# Patient Record
Sex: Female | Born: 1945 | Race: White | Hispanic: No | Marital: Married | Smoking: Never smoker
Health system: Southern US, Community
[De-identification: ages and names within clinical notes are randomized; demographics above are authoritative.]

## PROBLEM LIST (undated history)

## (undated) DIAGNOSIS — M1990 Unspecified osteoarthritis, unspecified site: Secondary | ICD-10-CM

## (undated) DIAGNOSIS — E079 Disorder of thyroid, unspecified: Secondary | ICD-10-CM

## (undated) DIAGNOSIS — C50919 Malignant neoplasm of unspecified site of unspecified female breast: Secondary | ICD-10-CM

## (undated) DIAGNOSIS — I10 Essential (primary) hypertension: Secondary | ICD-10-CM

## (undated) HISTORY — PX: MASTECTOMY: SHX3

## (undated) HISTORY — PX: ABDOMINAL HYSTERECTOMY: SHX81

## (undated) HISTORY — PX: BACK SURGERY: SHX140

---

## 2015-10-18 ENCOUNTER — Inpatient Hospital Stay (HOSPITAL_BASED_OUTPATIENT_CLINIC_OR_DEPARTMENT_OTHER)
Admission: EM | Admit: 2015-10-18 | Discharge: 2015-10-20 | DRG: 392 | Disposition: A | Discharge status: Home / Self Care | Payer: Medicare Other | Attending: Internal Medicine | Admitting: Internal Medicine

## 2015-10-18 ENCOUNTER — Encounter (HOSPITAL_BASED_OUTPATIENT_CLINIC_OR_DEPARTMENT_OTHER): Payer: Self-pay | Admitting: Emergency Medicine

## 2015-10-18 DIAGNOSIS — E871 Hypo-osmolality and hyponatremia: Secondary | ICD-10-CM | POA: Diagnosis present

## 2015-10-18 DIAGNOSIS — R112 Nausea with vomiting, unspecified: Secondary | ICD-10-CM | POA: Diagnosis not present

## 2015-10-18 DIAGNOSIS — I10 Essential (primary) hypertension: Secondary | ICD-10-CM | POA: Insufficient documentation

## 2015-10-18 DIAGNOSIS — F419 Anxiety disorder, unspecified: Secondary | ICD-10-CM | POA: Diagnosis present

## 2015-10-18 DIAGNOSIS — Z9010 Acquired absence of unspecified breast and nipple: Secondary | ICD-10-CM

## 2015-10-18 DIAGNOSIS — F39 Unspecified mood [affective] disorder: Secondary | ICD-10-CM | POA: Diagnosis present

## 2015-10-18 DIAGNOSIS — R630 Anorexia: Secondary | ICD-10-CM | POA: Diagnosis present

## 2015-10-18 DIAGNOSIS — Z853 Personal history of malignant neoplasm of breast: Secondary | ICD-10-CM

## 2015-10-18 DIAGNOSIS — Z79899 Other long term (current) drug therapy: Secondary | ICD-10-CM

## 2015-10-18 DIAGNOSIS — T43225A Adverse effect of selective serotonin reuptake inhibitors, initial encounter: Secondary | ICD-10-CM | POA: Diagnosis present

## 2015-10-18 DIAGNOSIS — E876 Hypokalemia: Secondary | ICD-10-CM | POA: Diagnosis present

## 2015-10-18 DIAGNOSIS — Z880 Allergy status to penicillin: Secondary | ICD-10-CM

## 2015-10-18 DIAGNOSIS — E039 Hypothyroidism, unspecified: Secondary | ICD-10-CM | POA: Insufficient documentation

## 2015-10-18 DIAGNOSIS — E860 Dehydration: Secondary | ICD-10-CM | POA: Diagnosis present

## 2015-10-18 DIAGNOSIS — E785 Hyperlipidemia, unspecified: Secondary | ICD-10-CM | POA: Insufficient documentation

## 2015-10-18 HISTORY — DX: Disorder of thyroid, unspecified: E07.9

## 2015-10-18 HISTORY — DX: Unspecified osteoarthritis, unspecified site: M19.90

## 2015-10-18 HISTORY — DX: Malignant neoplasm of unspecified site of unspecified female breast: C50.919

## 2015-10-18 HISTORY — DX: Essential (primary) hypertension: I10

## 2015-10-18 LAB — BASIC METABOLIC PANEL
ANION GAP: 12 (ref 5–15)
BUN: 12 mg/dL (ref 6–20)
CO2: 26 mmol/L (ref 22–32)
Calcium: 9.3 mg/dL (ref 8.9–10.3)
Chloride: 82 mmol/L — ABNORMAL LOW (ref 101–111)
Creatinine, Ser: 0.62 mg/dL (ref 0.44–1.00)
GFR calc Af Amer: >60 mL/min (ref >60)
GLUCOSE: 123 mg/dL — AB (ref 65–99)
POTASSIUM: 2.9 mmol/L — AB (ref 3.5–5.1)
Sodium: 120 mmol/L — ABNORMAL LOW (ref 135–145)

## 2015-10-18 LAB — CBC
HCT: 39.7 % (ref 36.0–46.0)
HEMOGLOBIN: 14.9 g/dL (ref 12.0–15.0)
MCH: 33.8 pg (ref 26.0–34.0)
MCHC: 37.5 g/dL — AB (ref 30.0–36.0)
MCV: 90 fL (ref 78.0–100.0)
Platelets: 352 10*3/uL (ref 150–400)
RBC: 4.41 MIL/uL (ref 3.87–5.11)
RDW: 11.2 % — ABNORMAL LOW (ref 11.5–15.5)
WBC: 14.9 10*3/uL — ABNORMAL HIGH (ref 4.0–10.5)

## 2015-10-18 MED ORDER — SODIUM CHLORIDE 0.9 % IV BOLUS (SEPSIS)
500.0000 mL | Freq: Once | INTRAVENOUS | Status: AC
  Administered 2015-10-18: 500 mL via INTRAVENOUS

## 2015-10-18 MED ORDER — MAGNESIUM SULFATE 2 GM/50ML IV SOLN
2.0000 g | Freq: Once | INTRAVENOUS | Status: AC
  Administered 2015-10-19: 2 g via INTRAVENOUS
  Filled 2015-10-18: qty 50

## 2015-10-18 MED ORDER — POTASSIUM CHLORIDE CRYS ER 20 MEQ PO TBCR
60.0000 meq | EXTENDED_RELEASE_TABLET | Freq: Once | ORAL | Status: AC
  Administered 2015-10-18: 60 meq via ORAL
  Filled 2015-10-18: qty 3

## 2015-10-18 MED ORDER — POTASSIUM CHLORIDE 10 MEQ/100ML IV SOLN
10.0000 meq | INTRAVENOUS | Status: AC
  Administered 2015-10-18: 10 meq via INTRAVENOUS
  Filled 2015-10-18: qty 100

## 2015-10-18 MED ORDER — ONDANSETRON HCL 4 MG/2ML IJ SOLN
4.0000 mg | Freq: Once | INTRAMUSCULAR | Status: AC
  Administered 2015-10-18: 4 mg via INTRAVENOUS
  Filled 2015-10-18: qty 2

## 2015-10-18 NOTE — ED Provider Notes (Addendum)
CSN: [id number]     Arrival date & time 10/18/15  2114 History  By signing my name below, I, Evelene Croon, attest that this documentation has been prepared under the direction and in the presence of Sharlett Iles, MD . Electronically Signed: Evelene Croon, Scribe. 10/18/2015. 11:05 PM.    Chief Complaint  Patient presents with  . Critical Lab Value from PCP    The history is provided by the patient. No language interpreter was used.    HPI Comments:  Paula Perez is a 70 y.o. female who presents to the Emergency Department from PCP's office for abnormal lab work. She was prescribed Paroxetine for anxiety which she started taking 1 week ago and "had a reaction to it". She states she became nauseous, vomited, and had palpitations after she started taking it. She stopped taking the pill after 4 days but the nausea has persisted. She also notes associated lightheadedness.  She followed up with PCP today and had blood work drawn. Her sodium and potassium levels were very low and she was sent to ED for further evaluation. No alleviating factors noted. Pt denies pain at this time. No fevers, abdominal pain, cough/cold symptoms, or recent illness. She notes that she has had a decreased appetite for "a while." No urinary sx.  PCP- [facility])   Past Medical History  Diagnosis Date  . Thyroid disease   . Hypertension   . Arthritis   . Cancer [facility])    Past Surgical History  Procedure Laterality Date  . Breast surgery    . Abdominal hysterectomy    . Back surgery     No family history on file. Social History  Substance Use Topics  . Smoking status: Never Smoker   . Smokeless tobacco: None  . Alcohol Use: Yes   OB History    No data available     Review of Systems 10 systems reviewed and all are negative for acute change except as noted in the HPI.   Allergies  Penicillins  Home Medications   Prior to Admission medications   Medication Sig Start Date End Date  Taking? Authorizing Provider  hydrochlorothiazide (HYDRODIURIL) 12.5 MG tablet Take 12.5 mg by mouth daily.   Yes Historical Provider, MD  levothyroxine (SYNTHROID, LEVOTHROID) 100 MCG tablet Take 100 mcg by mouth daily before breakfast.   Yes Historical Provider, MD  Multiple Vitamin (MULTIVITAMIN) capsule Take 1 capsule by mouth daily.   Yes Historical Provider, MD   BP 163/100 mmHg  Pulse 107  Temp(Src) 97.8 F (36.6 C) (Oral)  Resp 18  Ht 5\' 3"  (1.6 m)  Wt 121 lb (54.885 kg)  BMI 21.44 kg/m2  SpO2 100%  LMP  (Approximate) Physical Exam  Constitutional: She is oriented to person, place, and time. She appears well-developed and well-nourished. No distress.  HENT:  Head: Normocephalic and atraumatic.  Mildly dry mucous membranes  Eyes: Conjunctivae are normal. Pupils are equal, round, and reactive to light.  Neck: Neck supple.  Cardiovascular: Normal rate, regular rhythm and normal heart sounds.   No murmur heard. Pulmonary/Chest: Effort normal and breath sounds normal.  Abdominal: Soft. Bowel sounds are normal. She exhibits no distension. There is no tenderness.  Musculoskeletal: She exhibits no edema.  Neurological: She is alert and oriented to person, place, and time.  Fluent speech  Skin: Skin is warm and dry. There is pallor.  Psychiatric: She has a normal mood and affect. Judgment normal.  Nursing note and vitals reviewed.  ED Course  .Critical Care Performed by: Sharlett Iles Authorized by: Sharlett Iles Total critical care time: 45 minutes Critical care time was exclusive of separately billable procedures and treating other patients. Critical care was necessary to treat or prevent imminent or life-threatening deterioration of the following conditions: metabolic crisis. Critical care was time spent personally by me on the following activities: development of treatment plan with patient or surrogate, discussions with consultants, evaluation of  patient's response to treatment, examination of patient, obtaining history from patient or surrogate, ordering and performing treatments and interventions, ordering and review of laboratory studies and re-evaluation of patient's condition.     DIAGNOSTIC STUDIES:  Oxygen Saturation is 100% on RA, normal by my interpretation.    COORDINATION OF CARE:  11:03 PM Pt will be transferred for admission. Discussed treatment plan with pt at bedside and pt agreed to plan.  Labs Review Labs Reviewed  BASIC METABOLIC PANEL - Abnormal; Notable for the following:    Sodium 120 (*)    Potassium 2.9 (*)    Chloride 82 (*)    Glucose, Bld 123 (*)    All other components within normal limits  CBC - Abnormal; Notable for the following:    WBC 14.9 (*)    MCHC 37.5 (*)    RDW 11.2 (*)    All other components within normal limits  URINALYSIS, ROUTINE W REFLEX MICROSCOPIC (NOT AT [facility])    Imaging Review No results found. I have personally reviewed and evaluated these lab results as part of my medical decision-making.   EKG Interpretation   Date/Time:  Tuesday October 18 2015 22:32:17 EST Ventricular Rate:  90 PR Interval:  142 QRS Duration: 94 QT Interval:  369 QTC Calculation: 451 R Axis:   83 Text Interpretation:  Sinus rhythm Ventricular premature complex  Borderline right axis deviation Nonspecific T abnormalities, lateral leads  No previous ECGs available Confirmed by Kaja Jackowski MD, Nyela Cortinas [phone]) on  10/18/2015 11:07:58 PM     Medications  potassium chloride 10 mEq in 100 mL IVPB (10 mEq Intravenous New Bag/Given 10/18/15 2250)  magnesium sulfate IVPB 2 g 50 mL (not administered)  ondansetron (ZOFRAN) injection 4 mg (4 mg Intravenous Given 10/18/15 2241)  potassium chloride SA (K-DUR,KLOR-CON) CR tablet 60 mEq (60 mEq Oral Given 10/18/15 2242)  sodium chloride 0.9 % bolus 500 mL (500 mLs Intravenous New Bag/Given 10/18/15 2238)    MDM   Final diagnoses:  Hyponatremia  Hypokalemia    Patient presents for evaluation after she had routine blood work done at her PCPs office and was found to have abnormal sodium and potassium levels. She recently was on paroxetine briefly but discontinued after 4 days due to concern for side effects. On exam, she was pale and frail appearing but in no acute distress. Vital signs notable for hypertension at 163/100. No abdominal tenderness and no complaints the exception of mild nausea. She denies any infectious symptoms.   EKG w/ non-specific T wave changes, no previous available for comparison.  Lab work here shows sodium 120, potassium 2.9, chloride 82. Normal creatinine. WBC slightly elevated at 14.9, UA ordered. Gave the patient IV and oral potassium repletion as well as magnesium. Also gave Zofran and a small IV fluid bolus. I discussed with patient the life-threatening nature of hyponatremia and need for inpatient treatment. I am reassured that she is not demonstrating any neurologic symptoms. She has requested [facility]; discussed with hospitalist there, Dr. Pincus Large, appreciate his assistance. He  has accepted the patient. She will be transferred for further care.  12:52 AM Spoke with [facility] and they have no available beds. I discussed with [facility] hospitalist, Dr. Arnoldo Morale, who has accepted the patient. The patient is in agreement to transport to [facility].  I personally performed the services described in this documentation, which was scribed in my presence. The recorded information has been reviewed and is accurate.    Sharlett Iles, MD 10/18/15 [place], MD 10/19/15 [phone]

## 2015-10-18 NOTE — ED Notes (Signed)
Pt had blood work done at PCP, reports critical NA and K+ level and to go the ER tonight. Symptoms nausea and lightheaded. Recently taking a medication for anxiety but stopped due to the symptoms.

## 2015-10-18 NOTE — ED Notes (Signed)
MD at bedside. 

## 2015-10-19 ENCOUNTER — Encounter (HOSPITAL_BASED_OUTPATIENT_CLINIC_OR_DEPARTMENT_OTHER): Payer: Self-pay | Admitting: Internal Medicine

## 2015-10-19 ENCOUNTER — Inpatient Hospital Stay (HOSPITAL_COMMUNITY): Payer: Medicare Other

## 2015-10-19 DIAGNOSIS — E876 Hypokalemia: Secondary | ICD-10-CM | POA: Diagnosis present

## 2015-10-19 DIAGNOSIS — R112 Nausea with vomiting, unspecified: Secondary | ICD-10-CM | POA: Diagnosis present

## 2015-10-19 DIAGNOSIS — Z9010 Acquired absence of unspecified breast and nipple: Secondary | ICD-10-CM | POA: Diagnosis not present

## 2015-10-19 DIAGNOSIS — E785 Hyperlipidemia, unspecified: Secondary | ICD-10-CM | POA: Diagnosis present

## 2015-10-19 DIAGNOSIS — I10 Essential (primary) hypertension: Secondary | ICD-10-CM | POA: Diagnosis present

## 2015-10-19 DIAGNOSIS — E871 Hypo-osmolality and hyponatremia: Secondary | ICD-10-CM | POA: Diagnosis present

## 2015-10-19 DIAGNOSIS — E039 Hypothyroidism, unspecified: Secondary | ICD-10-CM | POA: Diagnosis present

## 2015-10-19 DIAGNOSIS — F419 Anxiety disorder, unspecified: Secondary | ICD-10-CM | POA: Diagnosis present

## 2015-10-19 DIAGNOSIS — Z880 Allergy status to penicillin: Secondary | ICD-10-CM | POA: Diagnosis not present

## 2015-10-19 DIAGNOSIS — Z79899 Other long term (current) drug therapy: Secondary | ICD-10-CM | POA: Diagnosis not present

## 2015-10-19 DIAGNOSIS — E860 Dehydration: Secondary | ICD-10-CM | POA: Diagnosis present

## 2015-10-19 DIAGNOSIS — R630 Anorexia: Secondary | ICD-10-CM | POA: Diagnosis present

## 2015-10-19 DIAGNOSIS — T43225A Adverse effect of selective serotonin reuptake inhibitors, initial encounter: Secondary | ICD-10-CM | POA: Diagnosis present

## 2015-10-19 DIAGNOSIS — Z853 Personal history of malignant neoplasm of breast: Secondary | ICD-10-CM | POA: Diagnosis not present

## 2015-10-19 DIAGNOSIS — F39 Unspecified mood [affective] disorder: Secondary | ICD-10-CM | POA: Diagnosis present

## 2015-10-19 LAB — URINALYSIS, ROUTINE W REFLEX MICROSCOPIC
BILIRUBIN URINE: NEGATIVE
GLUCOSE, UA: NEGATIVE mg/dL
Hgb urine dipstick: NEGATIVE
KETONES UR: 15 mg/dL — AB
Leukocytes, UA: NEGATIVE
Nitrite: NEGATIVE
PH: 6 (ref 5.0–8.0)
Protein, ur: NEGATIVE mg/dL
Specific Gravity, Urine: 1.01 (ref 1.005–1.030)

## 2015-10-19 LAB — BASIC METABOLIC PANEL
ANION GAP: 10 (ref 5–15)
BUN: 6 mg/dL (ref 6–20)
CALCIUM: 8.3 mg/dL — AB (ref 8.9–10.3)
CHLORIDE: 90 mmol/L — AB (ref 101–111)
CO2: 25 mmol/L (ref 22–32)
CREATININE: 0.46 mg/dL (ref 0.44–1.00)
GFR calc Af Amer: >60 mL/min (ref >60)
GFR calc non Af Amer: >60 mL/min (ref >60)
GLUCOSE: 98 mg/dL (ref 65–99)
Potassium: 3.3 mmol/L — ABNORMAL LOW (ref 3.5–5.1)
Sodium: 125 mmol/L — ABNORMAL LOW (ref 135–145)

## 2015-10-19 LAB — OSMOLALITY, URINE: Osmolality, Ur: 171 mOsm/kg — ABNORMAL LOW (ref 300–900)

## 2015-10-19 LAB — SODIUM, URINE, RANDOM: Sodium, Ur: 18 mmol/L

## 2015-10-19 LAB — PHOSPHORUS: PHOSPHORUS: 3.3 mg/dL (ref 2.5–4.6)

## 2015-10-19 MED ORDER — ENOXAPARIN SODIUM 40 MG/0.4ML ~~LOC~~ SOLN
40.0000 mg | SUBCUTANEOUS | Status: DC
  Administered 2015-10-19 – 2015-10-20 (×2): 40 mg via SUBCUTANEOUS
  Filled 2015-10-19 (×2): qty 0.4

## 2015-10-19 MED ORDER — SODIUM CHLORIDE 0.9 % IV SOLN
INTRAVENOUS | Status: DC
  Administered 2015-10-19 – 2015-10-20 (×4): via INTRAVENOUS

## 2015-10-19 MED ORDER — LEVOTHYROXINE SODIUM 100 MCG PO TABS
100.0000 ug | ORAL_TABLET | Freq: Every day | ORAL | Status: DC
  Administered 2015-10-19 – 2015-10-20 (×2): 100 ug via ORAL
  Filled 2015-10-19 (×2): qty 1

## 2015-10-19 MED ORDER — POTASSIUM CHLORIDE CRYS ER 20 MEQ PO TBCR
40.0000 meq | EXTENDED_RELEASE_TABLET | ORAL | Status: AC
  Administered 2015-10-19 (×2): 40 meq via ORAL
  Filled 2015-10-19 (×2): qty 2

## 2015-10-19 MED ORDER — ADULT MULTIVITAMIN W/MINERALS CH
1.0000 | ORAL_TABLET | Freq: Every day | ORAL | Status: DC
  Filled 2015-10-19 (×2): qty 1

## 2015-10-19 NOTE — Progress Notes (Signed)
Coming From [facility] ED to Tele Bed at [facility] ( No Tele Beds at [facility])  70 yo F with Hyponatremia (120) and Hypokalemia (2.9), seen at PCP today and had labs done found abnl and sent to ED.  She had been started on Paxil Rx last week and had increased dry heaves, so she stopped it 4 days ago.  Saw PCP today due to Weakness and Nausea.      Remote Hx of Breast CA S/P Mastectomy, Hx of HTN, and Thyroid Dz,   EDP : Dr Theotis Burrow PCP: Scarlette Ar

## 2015-10-19 NOTE — Progress Notes (Signed)
Resumed care of patient. Agree with previous assessment. Will continue to monitor. 

## 2015-10-19 NOTE — H&P (Signed)
[facility] History and Physical  Aquira Staser [id number] DOB: 05-Jan-1946 DOA: 10/18/2015  Referring physician: EDP PCP: Nilda Simmer, MD   Chief Complaint: Hyponatremia   HPI: Paula Perez is a 70 y.o. female who presents to the ED from PCPs office for abnormal lab work.  Patient was prescribed paroxetine for anxiety which she started taking about 1 week ago.  She had a very negative reaction to this medication with nausea, anorexia, vomiting, and ultimately hyperemesis with dry heaves.  She stopped taking the medication after 4 days but nausea and associated lightheadedness has persisted.  Work up in the ED today confirms hyponatremia with sodium of 120, hypokalemia with potassium of 2.9.  Review of Systems: Systems reviewed.  As above, otherwise negative  Past Medical History  Diagnosis Date  . Thyroid disease   . Hypertension   . Arthritis   . Breast cancer ([place])     1991 Mastectomy   Past Surgical History  Procedure Laterality Date  . Mastectomy      Ma  . Abdominal hysterectomy    . Back surgery     Social History:  reports that she has never smoked. She does not have any smokeless tobacco history on file. She reports that she drinks alcohol. She reports that she does not use illicit drugs.  Allergies  Allergen Reactions  . Penicillins Rash    No family history on file.   Prior to Admission medications   Medication Sig Start Date End Date Taking? Authorizing Provider  hydrochlorothiazide (HYDRODIURIL) 12.5 MG tablet Take 12.5 mg by mouth daily.   Yes Historical Provider, MD  levothyroxine (SYNTHROID, LEVOTHROID) 100 MCG tablet Take 100 mcg by mouth daily before breakfast.   Yes Historical Provider, MD  Multiple Vitamin (MULTIVITAMIN) capsule Take 1 capsule by mouth daily.   Yes Historical Provider, MD   Physical Exam: Filed Vitals:   10/19/15 0100 10/19/15 0235  BP: 126/88 139/79  Pulse: 89 91  Temp:  98.2 F (36.8 C)  Resp: 18 18    BP 139/79  mmHg  Pulse 91  Temp(Src) 98.2 F (36.8 C) (Oral)  Resp 18  Ht 5\' 3"  (1.6 m)  Wt 56.2 kg (123 lb 14.4 oz)  BMI 21.95 kg/m2  SpO2 99%  LMP  (Approximate)  General Appearance:    Alert, oriented, no distress, appears stated age  Head:    Normocephalic, atraumatic  Eyes:    PERRL, EOMI, sclera non-icteric        Nose:   Nares without drainage or epistaxis. Mucosa, turbinates normal  Throat:   Moist mucous membranes. Oropharynx without erythema or exudate.  Neck:   Supple. No carotid bruits.  No thyromegaly.  No lymphadenopathy.   Back:     No CVA tenderness, no spinal tenderness  Lungs:     Clear to auscultation bilaterally, without wheezes, rhonchi or rales  Chest wall:    No tenderness to palpitation  Heart:    Regular rate and rhythm without murmurs, gallops, rubs  Abdomen:     Soft, non-tender, nondistended, normal bowel sounds, no organomegaly  Genitalia:    deferred  Rectal:    deferred  Extremities:   No clubbing, cyanosis or edema.  Pulses:   2+ and symmetric all extremities  Skin:   Skin color, texture, turgor normal, no rashes or lesions  Lymph nodes:   Cervical, supraclavicular, and axillary nodes normal  Neurologic:   CNII-XII intact. Normal strength, sensation and reflexes      throughout  Labs on Admission:  Basic Metabolic Panel:  Recent Labs Lab 10/18/15 2135  NA 120*  K 2.9*  CL 82*  CO2 26  GLUCOSE 123*  BUN 12  CREATININE 0.62  CALCIUM 9.3   Liver Function Tests: No results for input(s): AST, ALT, ALKPHOS, BILITOT, PROT, ALBUMIN in the last 168 hours. No results for input(s): LIPASE, AMYLASE in the last 168 hours. No results for input(s): AMMONIA in the last 168 hours. CBC:  Recent Labs Lab 10/18/15 2135  WBC 14.9*  HGB 14.9  HCT 39.7  MCV 90.0  PLT 352   Cardiac Enzymes: No results for input(s): CKTOTAL, CKMB, CKMBINDEX, TROPONINI in the last 168 hours.  BNP (last 3 results) No results for input(s): PROBNP in the last 8760  hours. CBG: No results for input(s): GLUCAP in the last 168 hours.  Radiological Exams on Admission: No results found.  EKG: Independently reviewed.  Assessment/Plan Active Problems:   Hyponatremia   Hypokalemia   1. Hyponatremia - 1. Likely due to nausea and vomiting as reaction to paroxetine 2. Off of paroxetine for past 4 days 3. Will also hold HCTZ 4. IVF 5. Repeat BMP in AM 6. Checking urine sodium and osm as well as CXR but SIADH seems less likely given the HPI. 2. Hypokalemia - replacing   Code Status: Full Code  Family Communication: Husband at bedside Disposition Plan: Admit to inpatient   Time spent: 50 min  Oluwasemilore Pascuzzi M. [facility] Pager [phone]  If 7AM-7PM, please contact the day team taking care of the patient [email or web address] Password [email or web address] 10/19/2015, 3:29 AM

## 2015-10-19 NOTE — Progress Notes (Signed)
Patient seen and examined. Admitted after midnight secondary to dehydration, nausea, hyponatremia and no feeling good in general. Patient recently started on Paxil with subsequent increase nausea vomiting and decreased appetite. Has not been able to eat for the last 4 days or so. Presented feeling weak, and with ongoing nausea and vomiting. Found to have dehydration with hyponatremia and other electrolytes disturbances. Patient admitted for further evaluation and treatment. Please refer to H&P written by Dr. Alcario Drought for further info/details on admission.  Patient is currently hemodynamically stable, denies any nausea and has been able to keep food down without any problems. Electrolytes given his steel slightly off, improved from blood work on admission.   Plan: -Continue fluid resuscitation -Continue electrolytes repletion -As needed antiemetics -Offensive medication on hold -will follow clinical response    Barton Dubois [id number]

## 2015-10-19 NOTE — ED Notes (Signed)
MD at bedside. 

## 2015-10-19 NOTE — Care Management Note (Signed)
Case Management Note  Patient Details  Name: Paula Perez MRN: [id number] Date of Birth: 1945/11/05  Subjective/Objective: 70 y/o f admitted w/hyponatremia. From home.                   Action/Plan:d/c plan home.   Expected Discharge Date:                 Expected Discharge Plan:  Home/Self Care  In-House Referral:     Discharge planning Services  CM Consult  Post Acute Care Choice:    Choice offered to:     DME Arranged:    DME Agency:     HH Arranged:    HH Agency:     Status of Service:  In process, will continue to follow  Medicare Important Message Given:    Date Medicare IM Given:    Medicare IM give by:    Date Additional Medicare IM Given:    Additional Medicare Important Message give by:     If discussed at [place] of Stay Meetings, dates discussed:    Additional Comments:  Dessa Phi, RN 10/19/2015, 2:45 PM

## 2015-10-20 DIAGNOSIS — R112 Nausea with vomiting, unspecified: Secondary | ICD-10-CM | POA: Insufficient documentation

## 2015-10-20 DIAGNOSIS — I10 Essential (primary) hypertension: Secondary | ICD-10-CM | POA: Insufficient documentation

## 2015-10-20 DIAGNOSIS — E785 Hyperlipidemia, unspecified: Secondary | ICD-10-CM

## 2015-10-20 DIAGNOSIS — E039 Hypothyroidism, unspecified: Secondary | ICD-10-CM | POA: Insufficient documentation

## 2015-10-20 LAB — CBC
HCT: 33.5 % — ABNORMAL LOW (ref 36.0–46.0)
HEMOGLOBIN: 11.3 g/dL — AB (ref 12.0–15.0)
MCH: 33.2 pg (ref 26.0–34.0)
MCHC: 33.7 g/dL (ref 30.0–36.0)
MCV: 98.5 fL (ref 78.0–100.0)
Platelets: 331 10*3/uL (ref 150–400)
RBC: 3.4 MIL/uL — AB (ref 3.87–5.11)
RDW: 12.5 % (ref 11.5–15.5)
WBC: 5.3 10*3/uL (ref 4.0–10.5)

## 2015-10-20 LAB — BASIC METABOLIC PANEL
ANION GAP: 6 (ref 5–15)
BUN: <5 mg/dL — ABNORMAL LOW (ref 6–20)
CO2: 22 mmol/L (ref 22–32)
Calcium: 7.9 mg/dL — ABNORMAL LOW (ref 8.9–10.3)
Chloride: 102 mmol/L (ref 101–111)
Creatinine, Ser: 0.38 mg/dL — ABNORMAL LOW (ref 0.44–1.00)
GFR calc non Af Amer: >60 mL/min (ref >60)
GLUCOSE: 96 mg/dL (ref 65–99)
POTASSIUM: 4.5 mmol/L (ref 3.5–5.1)
Sodium: 130 mmol/L — ABNORMAL LOW (ref 135–145)

## 2015-10-20 LAB — MAGNESIUM: Magnesium: 1.9 mg/dL (ref 1.7–2.4)

## 2015-10-20 MED ORDER — AMLODIPINE BESYLATE 5 MG PO TABS: 5.0000 mg | ORAL_TABLET | Freq: Every day | ORAL | Status: AC

## 2015-10-20 NOTE — Discharge Summary (Signed)
Physician Discharge Summary  Paula Perez [id number] DOB: [birth year] DOA: 10/18/2015  PCP: Nilda Simmer, MD  Admit date: 10/18/2015 Discharge date: 10/20/2015  Time spent: 35 minutes  Recommendations for Outpatient Follow-up:  Repeat BMET to follow electrolytes Reassess BP and adjust antihypertensive regimen as needed  Further evaluation and treatment of her mood disorder to be addressed as an outpatient    Discharge Diagnoses:  Nausea, vomiting anorexia Dehydration  Hyponatremia Hypokalemia HTN HLD Anxiety/mood disorder Hypothyroidism    Discharge Condition: stable and improved. Discharge home with instructions to follow up with PCP in 10 days.  Diet recommendation: heart healthy diet   Filed Weights   10/18/15 2121 10/19/15 0235  Weight: 54.885 kg (121 lb) 56.2 kg (123 lb 14.4 oz)    History of present illness:  As per H&P written by Dr. Alcario Drought on 10/18/15 70 y.o. female who presents to the ED from PCPs office for abnormal lab work. Patient was prescribed paroxetine for anxiety which she started taking about 1 week ago. She had a very negative reaction to this medication with nausea, anorexia, vomiting, and ultimately hyperemesis with dry heaves. She stopped taking the medication after 4 days but nausea and associated lightheadedness has persisted.  Work up in the ED today confirms hyponatremia with sodium of 120, hypokalemia with potassium of 2.9.  Hospital Course:  1-nausea/vomiting and anorexia: appears to be secondary to reaction to paxil -symptoms improved after stopping medication -but she was already severely dehydrated and with abnormal electrolytes  -electrolytes repleted -patient received fluid resuscitation -diuretics discontinued -stable and improved at discharge  2-hypothyroidism: -stable -continue synthroid  3-essential HTN: soft on admission -stable and rising at discharge -has been discharge on amlodipine daily -to minimize electrolytes  abnormalities from use of HCTZ  4-HLD: -continue statins  5-hyponatremia and hypokalemia -due to GI loses,poor PO intake and use of diuretics  -repleted and improved/resolved at discharge -will recommend BMET during follow up visit  6-anxiety/mood disorder -to be further addressed as an outpatient  -paxil discontinued    Procedures:  See below for x-ray reports   Consultations:  None   Discharge Exam: Filed Vitals:   10/19/15 2111 10/20/15 0454  BP:  136/84  Pulse: 80 95  Temp: 98 F (36.7 C) 97.9 F (36.6 C)  Resp: 18 18    General: afebrile, feeling much better. Denies CP, SOB, dizziness, nausea, vomiting or any other acute complaint Cardiovascular: S1 and S2, no rubs or gallops Respiratory: CTA bilaterally   Discharge Instructions   Discharge Instructions    Discharge instructions    Complete by:  As directed   Arrange follow up with PCP in 10 days Take medications as prescribed Keep yourself well hydrated  Follow heart healthy diet          Current Discharge Medication List    START taking these medications   Details  amLODipine (NORVASC) 5 MG tablet Take 1 tablet (5 mg total) by mouth daily. Qty: 30 tablet, Refills: 1      CONTINUE these medications which have NOT CHANGED   Details  beta carotene w/minerals (OCUVITE) tablet Take 1 tablet by mouth daily.    calcium carbonate (OS-CAL - DOSED IN MG OF ELEMENTAL CALCIUM) 1250 (500 Ca) MG tablet Take 1 tablet by mouth 2 (two) times daily with a meal.     cholecalciferol (VITAMIN D) 1000 units tablet Take 2,000 Units by mouth daily.    Flaxseed, Linseed, (FLAX SEED OIL) 1000 MG CAPS Take 1,000 mg by mouth daily.  fluticasone (FLONASE) 50 MCG/ACT nasal spray Place 2 sprays into both nostrils daily.    fluvastatin XL (LESCOL XL) 80 MG 24 hr tablet Take 80 mg by mouth daily.    hydrOXYzine (VISTARIL) 25 MG capsule Take 25-50 mg by mouth every 8 (eight) hours as needed. Anxiety    levothyroxine  (SYNTHROID, LEVOTHROID) 100 MCG tablet Take 100 mcg by mouth daily before breakfast.    Multiple Vitamin (MULTIVITAMIN) capsule Take 1 capsule by mouth daily.    ondansetron (ZOFRAN-ODT) 4 MG disintegrating tablet Take 4 mg by mouth every 8 (eight) hours as needed. nausea      STOP taking these medications     hydrochlorothiazide (HYDRODIURIL) 25 MG tablet      PARoxetine (PAXIL) 10 MG tablet        Allergies  Allergen Reactions  . Penicillins Rash       Follow-up Information    Follow up with [facility], [place]. Schedule an appointment as soon as possible for a visit in 10 days.   Specialty:  Family Medicine   Contact information:   [address] [place] [address] [phone]       The results of significant diagnostics from this hospitalization (including imaging, microbiology, ancillary and laboratory) are listed below for reference.    Significant Diagnostic Studies: Dg Chest Port 1 View  10/19/2015  CLINICAL DATA:  Hyponatremia EXAM: PORTABLE CHEST 1 VIEW COMPARISON:  None. FINDINGS: Heart is normal size. Mediastinal contours are within normal limits. No confluent airspace opacities or effusions. Severe thoracic scoliosis with posterior spinal rods. No acute bony abnormality. IMPRESSION: No active disease. Electronically Signed   By: Rolm Baptise M.D.   On: 10/19/2015 07:26   Labs: Basic Metabolic Panel:  Recent Labs Lab 10/18/15 2135 10/19/15 0516 10/20/15 0443  NA 120* 125* 130*  K 2.9* 3.3* 4.5  CL 82* 90* 102  CO2 26 25 22   GLUCOSE 123* 98 96  BUN 12 6 <5*  CREATININE 0.62 0.46 0.38*  CALCIUM 9.3 8.3* 7.9*  MG  --   --  1.9  PHOS  --  3.3  --    CBC:  Recent Labs Lab 10/18/15 2135 10/20/15 0443  WBC 14.9* 5.3  HGB 14.9 11.3*  HCT 39.7 33.5*  MCV 90.0 98.5  PLT 352 331   Signed:  Barton Dubois MD.  [facility] 10/20/2015, 11:06 AM

## 2015-10-20 NOTE — Care Management Note (Signed)
Case Management Note  Patient Details  Name: Jess Mote MRN: [id number] Date of Birth: 07/14/1946  Subjective/Objective:                    Action/Plan:d/c home no needs or orders.   Expected Discharge Date:                 Expected Discharge Plan:  Home/Self Care  In-House Referral:     Discharge planning Services  CM Consult  Post Acute Care Choice:    Choice offered to:     DME Arranged:    DME Agency:     HH Arranged:    [place] Agency:     Status of Service:  Completed, signed off  Medicare Important Message Given:    Date Medicare IM Given:    Medicare IM give by:    Date Additional Medicare IM Given:    Additional Medicare Important Message give by:     If discussed at [place] of Stay Meetings, dates discussed:    Additional Comments:  Dessa Phi, RN 10/20/2015, 11:20 AM

## 2016-10-20 IMAGING — DX DG CHEST 1V PORT
1 series · 1 of 1 positions shown · non-contrast
Comparison: None.

CLINICAL DATA: Hyponatremia

EXAM:
PORTABLE CHEST 1 VIEW

[chest ap]
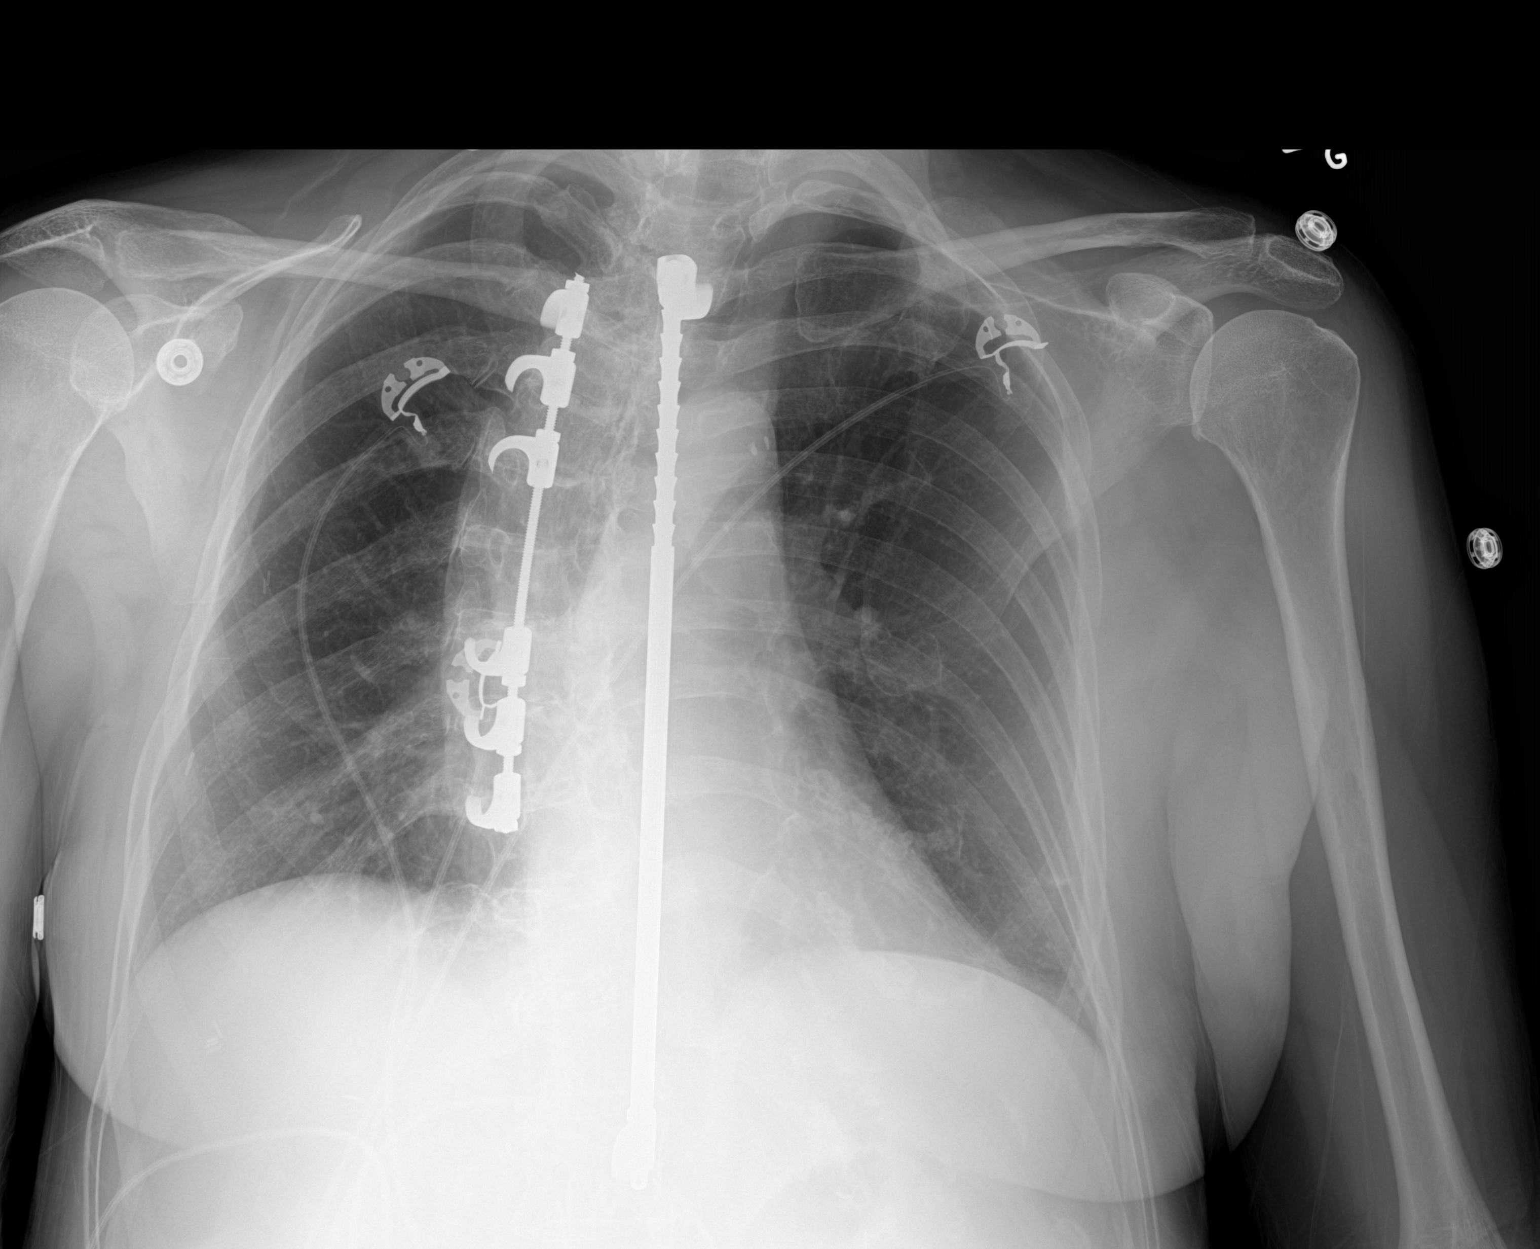

[1 of 1 positions shown; findings below may reference images not displayed]

FINDINGS: Heart is normal size. Mediastinal contours are within normal limits.
No confluent airspace opacities or effusions. Severe thoracic
scoliosis with posterior spinal rods. No acute bony abnormality.
IMPRESSION: No active disease.

## 2019-07-14 ENCOUNTER — Emergency Department (HOSPITAL_BASED_OUTPATIENT_CLINIC_OR_DEPARTMENT_OTHER)
Admission: EM | Admit: 2019-07-14 | Discharge: 2019-07-14 | Disposition: A | Discharge status: Home / Self Care | Payer: Federal, State, Local not specified - PPO | Attending: Emergency Medicine | Admitting: Emergency Medicine

## 2019-07-14 ENCOUNTER — Encounter (HOSPITAL_BASED_OUTPATIENT_CLINIC_OR_DEPARTMENT_OTHER): Payer: Self-pay | Admitting: Emergency Medicine

## 2019-07-14 ENCOUNTER — Other Ambulatory Visit: Payer: Self-pay

## 2019-07-14 DIAGNOSIS — W010XXA Fall on same level from slipping, tripping and stumbling without subsequent striking against object, initial encounter: Secondary | ICD-10-CM | POA: Diagnosis not present

## 2019-07-14 DIAGNOSIS — Y9301 Activity, walking, marching and hiking: Secondary | ICD-10-CM | POA: Diagnosis not present

## 2019-07-14 DIAGNOSIS — Z853 Personal history of malignant neoplasm of breast: Secondary | ICD-10-CM | POA: Insufficient documentation

## 2019-07-14 DIAGNOSIS — Y999 Unspecified external cause status: Secondary | ICD-10-CM | POA: Diagnosis not present

## 2019-07-14 DIAGNOSIS — S01511A Laceration without foreign body of lip, initial encounter: Secondary | ICD-10-CM | POA: Diagnosis not present

## 2019-07-14 DIAGNOSIS — I10 Essential (primary) hypertension: Secondary | ICD-10-CM | POA: Diagnosis not present

## 2019-07-14 DIAGNOSIS — Z23 Encounter for immunization: Secondary | ICD-10-CM | POA: Diagnosis not present

## 2019-07-14 DIAGNOSIS — W19XXXA Unspecified fall, initial encounter: Secondary | ICD-10-CM

## 2019-07-14 DIAGNOSIS — E079 Disorder of thyroid, unspecified: Secondary | ICD-10-CM | POA: Insufficient documentation

## 2019-07-14 DIAGNOSIS — Y92008 Other place in unspecified non-institutional (private) residence as the place of occurrence of the external cause: Secondary | ICD-10-CM | POA: Diagnosis not present

## 2019-07-14 MED ORDER — LIDOCAINE HCL (PF) 1 % IJ SOLN
5.0000 mL | Freq: Once | INTRAMUSCULAR | Status: DC
  Filled 2019-07-14: qty 5

## 2019-07-14 MED ORDER — SULFAMETHOXAZOLE-TRIMETHOPRIM 800-160 MG PO TABS: 1.0000 | ORAL_TABLET | Freq: Two times a day (BID) | ORAL | 0 refills | Status: AC

## 2019-07-14 MED ORDER — TETANUS-DIPHTH-ACELL PERTUSSIS 5-2.5-18.5 LF-MCG/0.5 IM SUSP
0.5000 mL | Freq: Once | INTRAMUSCULAR | Status: AC
  Administered 2019-07-14: 0.5 mL via INTRAMUSCULAR
  Filled 2019-07-14: qty 0.5

## 2019-07-14 NOTE — ED Provider Notes (Signed)
[facility] Emergency Department Provider Note MRN:  [id number]  Arrival date & time: 07/14/19     Chief Complaint   Fall   History of Present Illness   Paula Perez is a 73 y.o. year-old female with a history of hypertension presenting to the ED with chief complaint of fall.  Patient tripped on something in the garage and fell forward onto her face.  Denies loss consciousness, no nausea or vomiting since the fall, no blood thinners.  Sustained a laceration to her upper lip.  Denies neck pain, no chest pain or shortness of breath, no abdominal pain, no numbness or weakness to the arms or legs, no syncope causing the fall.  Pain is mild in severity, worse with palpation.  Review of Systems  A complete 10 system review of systems was obtained and all systems are negative except as noted in the HPI and PMH.   Patient's Health History    Past Medical History:  Diagnosis Date  . Arthritis   . Breast cancer ([place])    1991 Mastectomy  . Hypertension   . Thyroid disease     Past Surgical History:  Procedure Laterality Date  . ABDOMINAL HYSTERECTOMY    . BACK SURGERY    . MASTECTOMY     Ma    No family history on file.  Social History   Socioeconomic History  . Marital status: Married    Spouse name: Not on file  . Number of children: Not on file  . Years of education: Not on file  . Highest education level: Not on file  Occupational History  . Not on file  Social Needs  . Financial resource strain: Not on file  . Food insecurity    Worry: Not on file    Inability: Not on file  . Transportation needs    Medical: Not on file    Non-medical: Not on file  Tobacco Use  . Smoking status: Never Smoker  . Smokeless tobacco: Never Used  Substance and Sexual Activity  . Alcohol use: Yes  . Drug use: No  . Sexual activity: Not on file  Lifestyle  . Physical activity    Days per week: Not on file    Minutes per session: Not on file  .  Stress: Not on file  Relationships  . Social Herbalist on phone: Not on file    Gets together: Not on file    Attends religious service: Not on file    Active member of club or organization: Not on file    Attends meetings of clubs or organizations: Not on file    Relationship status: Not on file  . Intimate partner violence    Fear of current or ex partner: Not on file    Emotionally abused: Not on file    Physically abused: Not on file    Forced sexual activity: Not on file  Other Topics Concern  . Not on file  Social History Narrative  . Not on file     Physical Exam  Vital Signs and Nursing Notes reviewed Vitals:   07/14/19 0800 07/14/19 0805  BP:  (!) 217/95  Pulse:  79  Resp:  14  Temp:  98.1 F (36.7 C)  SpO2: 98% 99%    CONSTITUTIONAL: Well-appearing, NAD NEURO:  Alert and oriented x 3, no focal deficits EYES:  eyes equal and reactive ENT/NECK:  no LAD, no JVD; multiple irregular lacerations to the  upper lip CARDIO: Regular rate, well-perfused, normal S1 and S2 PULM:  CTAB no wheezing or rhonchi GI/GU:  normal bowel sounds, non-distended, non-tender MSK/SPINE:  No gross deformities, no edema SKIN:  no rash, atraumatic PSYCH:  Appropriate speech and behavior  Diagnostic and Interventional Summary    EKG Interpretation  Date/Time:    Ventricular Rate:    PR Interval:    QRS Duration:   QT Interval:    QTC Calculation:   R Axis:     Text Interpretation:        Labs Reviewed - No data to display  No orders to display    Medications  Tdap (BOOSTRIX) injection 0.5 mL (has no administration in time range)  lidocaine (PF) (XYLOCAINE) 1 % injection 5 mL (has no administration in time range)     Procedures  /  Critical Care .Marland KitchenLaceration Repair  Date/Time: 07/14/2019 8:59 AM Performed by: Maudie Flakes, MD Authorized by: Maudie Flakes, MD   Consent:    Consent obtained:  Verbal   Consent given by:  Patient   Risks discussed:   Infection, need for additional repair, poor cosmetic result and poor wound healing Anesthesia (see MAR for exact dosages):    Anesthesia method:  Local infiltration   Local anesthetic:  Lidocaine 1% w/o epi Laceration details:    Location:  Lip   Lip location:  Upper exterior lip   Length (cm):  3.8   Depth (mm):  2 Repair type:    Repair type:  Simple Pre-procedure details:    Preparation:  Patient was prepped and draped in usual sterile fashion Exploration:    Wound exploration: wound explored through full range of motion and entire depth of wound probed and visualized     Contaminated: no   Treatment:    Area cleansed with:  Saline Skin repair:    Repair method:  Sutures   Suture size:  5-0   Wound skin closure material used: Fast-absorbing Vicryl.   Suture technique:  Simple interrupted   Number of sutures:  3 Approximation:    Approximation:  Close   Vermilion border: well-aligned   Post-procedure details:    Dressing:  Open (no dressing)   Patient tolerance of procedure:  Tolerated well, no immediate complications .Marland KitchenLaceration Repair  Date/Time: 07/14/2019 9:00 AM Performed by: Maudie Flakes, MD Authorized by: Maudie Flakes, MD   Consent:    Consent obtained:  Verbal   Consent given by:  Patient   Risks discussed:  Infection, need for additional repair, poor cosmetic result and poor wound healing Anesthesia (see MAR for exact dosages):    Anesthesia method:  Local infiltration   Local anesthetic:  Lidocaine 1% w/o epi Laceration details:    Location:  Lip   Lip location: Upper lip, dry wet border.   Length (cm):  1   Depth (mm):  2 Repair type:    Repair type:  Simple Pre-procedure details:    Preparation:  Patient was prepped and draped in usual sterile fashion Exploration:    Wound exploration: wound explored through full range of motion and entire depth of wound probed and visualized     Contaminated: no   Treatment:    Area cleansed with:  Saline  Skin repair:    Repair method:  Sutures   Suture size:  5-0   Wound skin closure material used: Fast-absorbing Vicryl.   Suture technique:  Simple interrupted   Number of sutures:  2 Approximation:  Approximation:  Close Post-procedure details:    Dressing:  Open (no dressing)   Patient tolerance of procedure:  Tolerated well, no immediate complications .Marland KitchenLaceration Repair  Date/Time: 07/14/2019 9:01 AM Performed by: Maudie Flakes, MD Authorized by: Maudie Flakes, MD   Consent:    Consent obtained:  Verbal   Consent given by:  Patient   Risks discussed:  Infection, need for additional repair, nerve damage, poor cosmetic result, poor wound healing and retained foreign body Anesthesia (see MAR for exact dosages):    Anesthesia method:  Local infiltration   Local anesthetic:  Lidocaine 1% w/o epi Laceration details:    Location:  Lip   Lip location: Upper lip, dry wet border.   Length (cm):  1   Depth (mm):  2 Repair type:    Repair type:  Simple Pre-procedure details:    Preparation:  Patient was prepped and draped in usual sterile fashion Exploration:    Wound exploration: wound explored through full range of motion and entire depth of wound probed and visualized     Contaminated: no   Treatment:    Area cleansed with:  Saline Skin repair:    Repair method:  Sutures   Suture size:  5-0   Wound skin closure material used: Fast-absorbing Vicryl.   Suture technique:  Simple interrupted   Number of sutures:  2 Approximation:    Approximation:  Close Post-procedure details:    Dressing:  Open (no dressing)   Patient tolerance of procedure:  Tolerated well, no immediate complications .Marland KitchenLaceration Repair  Date/Time: 07/14/2019 9:03 AM Performed by: Maudie Flakes, MD Authorized by: Maudie Flakes, MD   Consent:    Consent obtained:  Verbal   Consent given by:  Patient   Risks discussed:  Infection, need for additional repair, nerve damage, poor cosmetic  result, poor wound healing and retained foreign body Anesthesia (see MAR for exact dosages):    Anesthesia method:  Local infiltration   Local anesthetic:  Lidocaine 1% w/o epi Laceration details:    Location:  Lip   Lip location:  Upper interior lip   Length (cm):  4   Depth (mm):  2 Repair type:    Repair type:  Simple Pre-procedure details:    Preparation:  Patient was prepped and draped in usual sterile fashion Exploration:    Wound exploration: wound explored through full range of motion and entire depth of wound probed and visualized     Contaminated: no   Treatment:    Area cleansed with:  Saline Skin repair:    Repair method:  Sutures   Suture size:  5-0   Wound skin closure material used: Fast-absorbing Vicryl.   Suture technique:  Simple interrupted   Number of sutures:  4 Approximation:    Approximation:  Close Post-procedure details:    Dressing:  Open (no dressing)   Patient tolerance of procedure:  Tolerated well, no immediate complications    ED Course and Medical Decision Making  I have reviewed the triage vital signs and the nursing notes.  Pertinent labs & imaging results that were available during my care of the patient were reviewed by me and considered in my medical decision making (see below for details).     Mechanical fall with complex upper lip laceration repaired as described above.  Upon closer inspection it was actually 4 different lacerations.  One laceration involving the vermilion border, 2 lacerations involving the dry white border, and one laceration of the  interior of the upper lip.  Approximated well, providing Bactrim for prophylaxis, advised Neosporin at home.  No dental fracture, it is possible that one of her teeth is slightly loose but seemed firmly placed to me, advised dental follow-up.    Barth Kirks. [place]onia Small, [place] [email or web address]  Final Clinical Impressions(s) / ED  Diagnoses     ICD-10-CM   1. Fall, initial encounter  W19.XXXA   2. Lip laceration, initial encounter  S01.511A     ED Discharge Orders         Ordered    sulfamethoxazole-trimethoprim (BACTRIM DS) 800-160 MG tablet  2 times daily     07/14/19 0856           Discharge Instructions Discussed with and Provided to Patient:     Discharge Instructions     You were evaluated in the Emergency Department and after careful evaluation, we did not find any emergent condition requiring admission or further testing in the hospital.  Your exam/testing today was overall reassuring.  Please use the Bactrim antibiotic as directed.  We recommend Neosporin on the wound 3 times daily for the next week.  Please return to the Emergency Department if you experience any worsening of your condition.  We encourage you to follow up with a primary care provider.  Thank you for allowing [place] to be a part of your care.       Maudie Flakes, MD 07/14/19 [phone]

## 2019-07-14 NOTE — ED Notes (Signed)
ED Provider at bedside. 

## 2019-07-14 NOTE — ED Triage Notes (Signed)
Pt arrived via EMS. Pt tripped and fell outside today. Denies LOC or taking blood thinners. Pt has a lip lac to upper lip and possible broken teeth per EMS. Also reported abrasion to R cheek and palms of hand.

## 2019-07-14 NOTE — Discharge Instructions (Addendum)
You were evaluated in the Emergency Department and after careful evaluation, we did not find any emergent condition requiring admission or further testing in the hospital.  Your exam/testing today was overall reassuring.  Please use the Bactrim antibiotic as directed.  We recommend Neosporin on the wound 3 times daily for the next week.  Please return to the Emergency Department if you experience any worsening of your condition.  We encourage you to follow up with a primary care provider.  Thank you for allowing [place] to be a part of your care.

## 2019-07-14 NOTE — ED Notes (Signed)
Husband at bedside.  

## 2019-07-14 NOTE — ED Notes (Signed)
ED Provider at bedside for lac repair
